# Patient Record
Sex: Male | Born: 1980 | Race: White | Hispanic: No | Marital: Single | Smoking: Current every day smoker
Health system: Southern US, Community
[De-identification: ages and names within clinical notes are randomized; demographics above are authoritative.]

---

## 2004-05-22 ENCOUNTER — Emergency Department: Payer: Self-pay | Admitting: Emergency Medicine

## 2005-06-22 ENCOUNTER — Emergency Department: Payer: Self-pay | Admitting: Unknown Physician Specialty

## 2005-09-28 ENCOUNTER — Emergency Department: Payer: Self-pay | Admitting: Emergency Medicine

## 2006-05-31 ENCOUNTER — Emergency Department: Payer: Self-pay | Admitting: Emergency Medicine

## 2006-06-06 ENCOUNTER — Emergency Department: Payer: Self-pay | Admitting: Emergency Medicine

## 2006-12-30 ENCOUNTER — Emergency Department: Payer: Self-pay | Admitting: Emergency Medicine

## 2007-07-22 ENCOUNTER — Emergency Department: Payer: Self-pay | Admitting: Emergency Medicine

## 2011-12-30 ENCOUNTER — Emergency Department: Payer: Self-pay | Admitting: Emergency Medicine

## 2012-02-11 ENCOUNTER — Emergency Department: Payer: Self-pay | Admitting: Emergency Medicine

## 2012-02-11 LAB — RAPID INFLUENZA A&B ANTIGENS

## 2013-02-04 ENCOUNTER — Emergency Department: Payer: Self-pay | Admitting: Emergency Medicine

## 2015-01-11 ENCOUNTER — Emergency Department
Admission: EM | Admit: 2015-01-11 | Discharge: 2015-01-11 | Disposition: A | Discharge status: Home / Self Care | Payer: Self-pay | Attending: Emergency Medicine | Admitting: Emergency Medicine

## 2015-01-11 ENCOUNTER — Emergency Department: Payer: Self-pay

## 2015-01-11 ENCOUNTER — Encounter: Payer: Self-pay | Admitting: Emergency Medicine

## 2015-01-11 DIAGNOSIS — F1210 Cannabis abuse, uncomplicated: Secondary | ICD-10-CM | POA: Insufficient documentation

## 2015-01-11 DIAGNOSIS — F1110 Opioid abuse, uncomplicated: Secondary | ICD-10-CM | POA: Insufficient documentation

## 2015-01-11 DIAGNOSIS — Z880 Allergy status to penicillin: Secondary | ICD-10-CM | POA: Insufficient documentation

## 2015-01-11 DIAGNOSIS — F17200 Nicotine dependence, unspecified, uncomplicated: Secondary | ICD-10-CM | POA: Insufficient documentation

## 2015-01-11 DIAGNOSIS — R079 Chest pain, unspecified: Secondary | ICD-10-CM | POA: Insufficient documentation

## 2015-01-11 DIAGNOSIS — R0602 Shortness of breath: Secondary | ICD-10-CM | POA: Insufficient documentation

## 2015-01-11 LAB — URINE DRUG SCREEN, QUALITATIVE (ARMC ONLY)
AMPHETAMINES, UR SCREEN: NOT DETECTED
BENZODIAZEPINE, UR SCRN: NOT DETECTED
Barbiturates, Ur Screen: NOT DETECTED
COCAINE METABOLITE, UR ~~LOC~~: NOT DETECTED
Cannabinoid 50 Ng, Ur ~~LOC~~: POSITIVE — AB
MDMA (Ecstasy)Ur Screen: NOT DETECTED
Methadone Scn, Ur: NOT DETECTED
OPIATE, UR SCREEN: POSITIVE — AB
PHENCYCLIDINE (PCP) UR S: NOT DETECTED
Tricyclic, Ur Screen: NOT DETECTED

## 2015-01-11 LAB — CBC
HEMATOCRIT: 43.3 % (ref 40.0–52.0)
HEMOGLOBIN: 14.9 g/dL (ref 13.0–18.0)
MCH: 32.3 pg (ref 26.0–34.0)
MCHC: 34.3 g/dL (ref 32.0–36.0)
MCV: 94.2 fL (ref 80.0–100.0)
Platelets: 167 10*3/uL (ref 150–440)
RBC: 4.6 MIL/uL (ref 4.40–5.90)
RDW: 13.4 % (ref 11.5–14.5)
WBC: 12.8 10*3/uL — ABNORMAL HIGH (ref 3.8–10.6)

## 2015-01-11 LAB — COMPREHENSIVE METABOLIC PANEL
ALK PHOS: 80 U/L (ref 38–126)
ALT: 28 U/L (ref 17–63)
AST: 51 U/L — ABNORMAL HIGH (ref 15–41)
Albumin: 3.8 g/dL (ref 3.5–5.0)
Anion gap: 4 — ABNORMAL LOW (ref 5–15)
BILIRUBIN TOTAL: 0.3 mg/dL (ref 0.3–1.2)
BUN: 10 mg/dL (ref 6–20)
CALCIUM: 9.1 mg/dL (ref 8.9–10.3)
CO2: 28 mmol/L (ref 22–32)
Chloride: 105 mmol/L (ref 101–111)
Creatinine, Ser: 0.84 mg/dL (ref 0.61–1.24)
Glucose, Bld: 104 mg/dL — ABNORMAL HIGH (ref 65–99)
Potassium: 4.8 mmol/L (ref 3.5–5.1)
Sodium: 137 mmol/L (ref 135–145)
TOTAL PROTEIN: 7.4 g/dL (ref 6.5–8.1)

## 2015-01-11 LAB — TROPONIN I: Troponin I: <0.03 ng/mL (ref <0.031)

## 2015-01-11 IMAGING — CT CT ANGIO CHEST
1 of 2 series · 18 of 30 positions shown · IV contrast (APPLIED)
Comparison: [DATE]

CLINICAL DATA: Acute mid sternal chest pain with shortness of
breath since [REDACTED].

EXAM:
CT ANGIOGRAPHY CHEST WITH CONTRAST
TECHNIQUE: Multidetector CT imaging of the chest was performed using the
standard protocol during bolus administration of intravenous
contrast. Multiplanar CT image reconstructions and MIPs were
obtained to evaluate the vascular anatomy.
CONTRAST:  75mL OMNIPAQUE IOHEXOL 350 MG/ML SOLN

[Series 5: pe 1.0 thins · axial · 0.64mm/px · z∈[-708,-404]mm · 18 of 345 slices shown]
[im 20/345  lung]
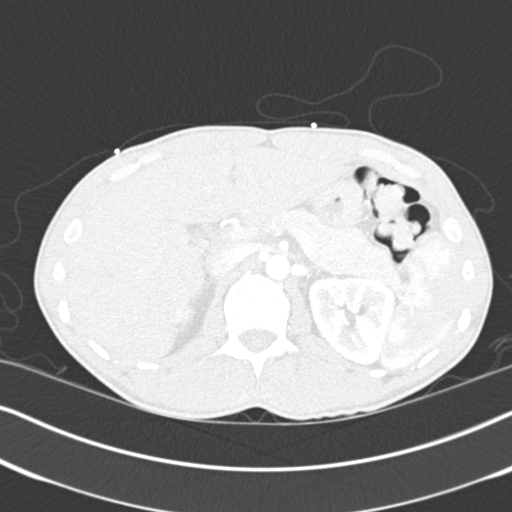
[im 39/345  mediastinal]
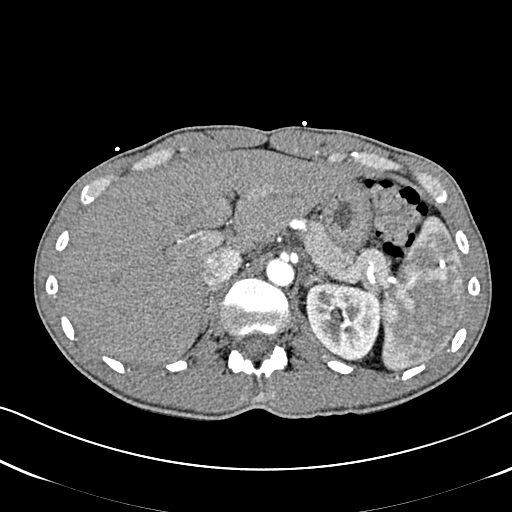
[im 58/345  lung]
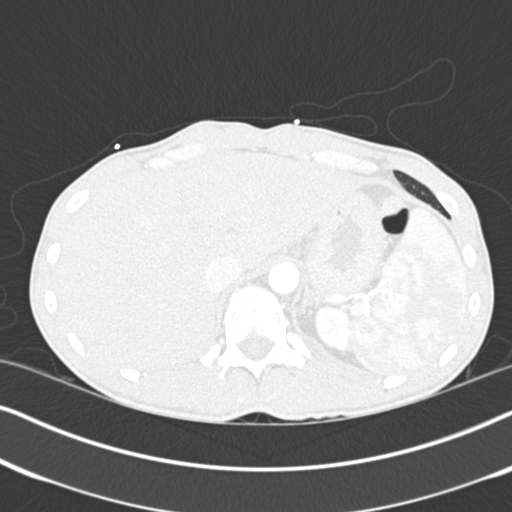
[im 77/345  mediastinal]
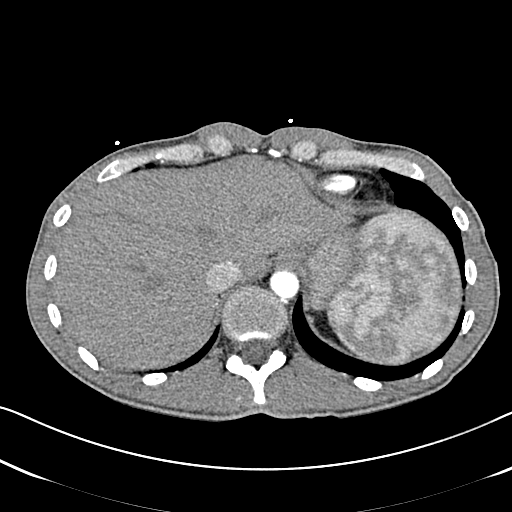
[im 96/345  lung]
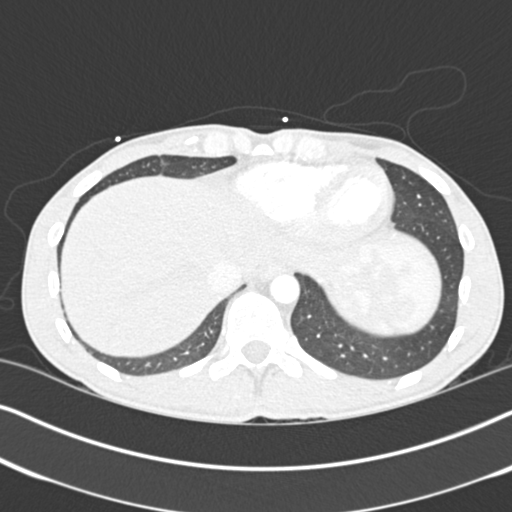
[im 115/345  mediastinal]
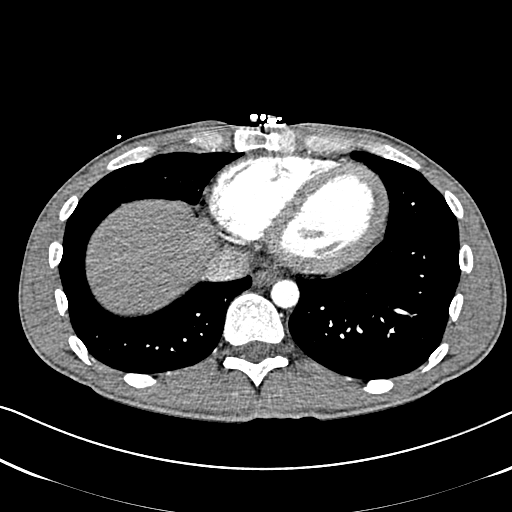
[im 134/345  lung]
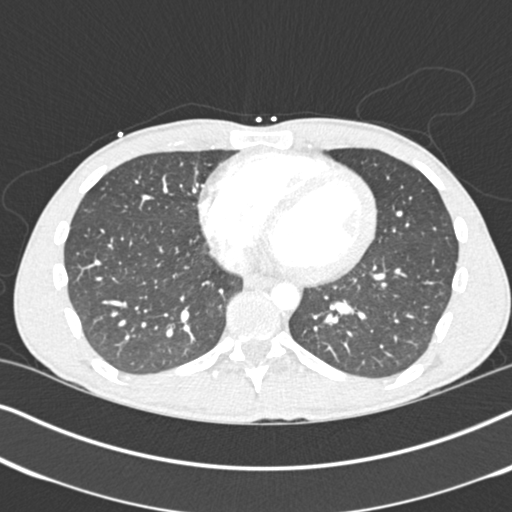
[im 153/345  mediastinal]
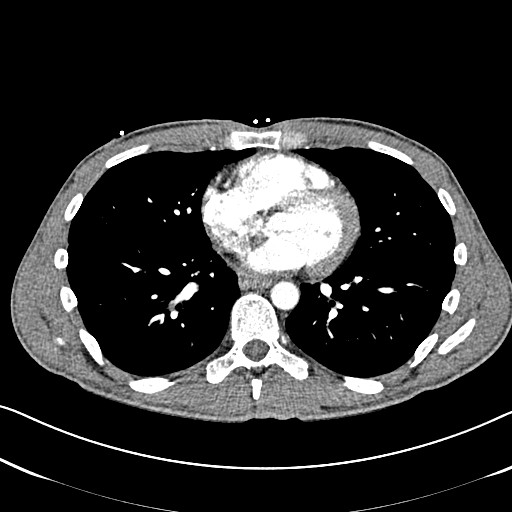
[im 163/345  lung]
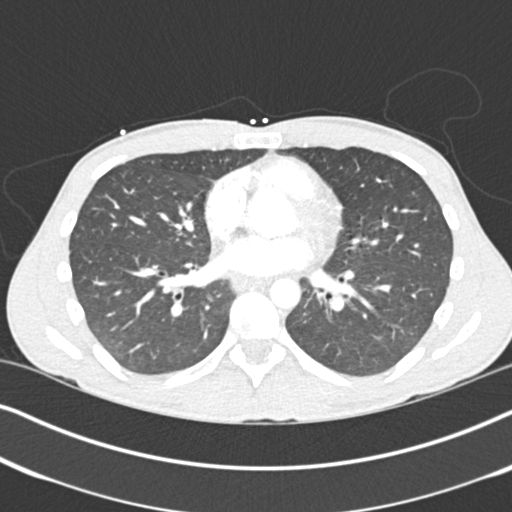
[im 173/345  mediastinal]
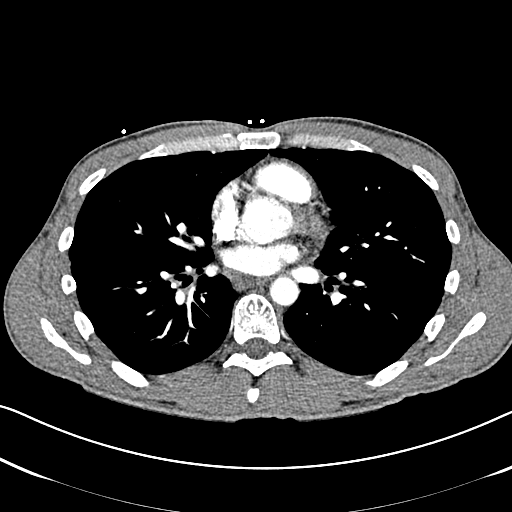
[im 192/345  lung]
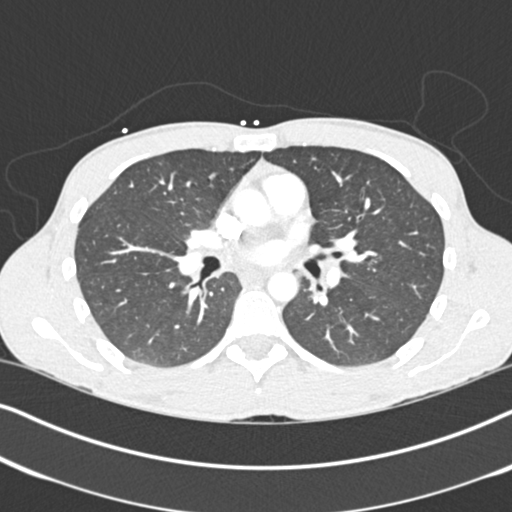
[im 211/345  mediastinal]
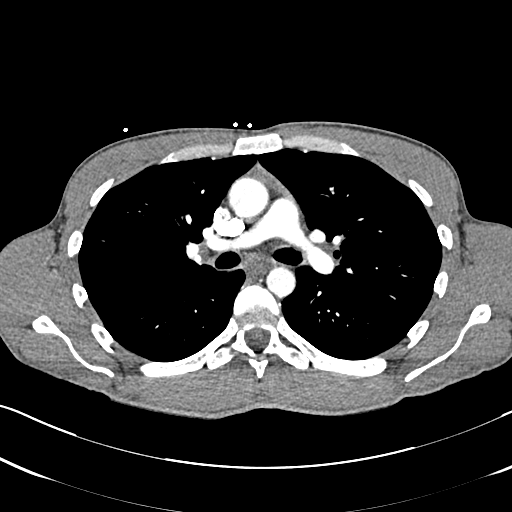
[im 230/345  lung]
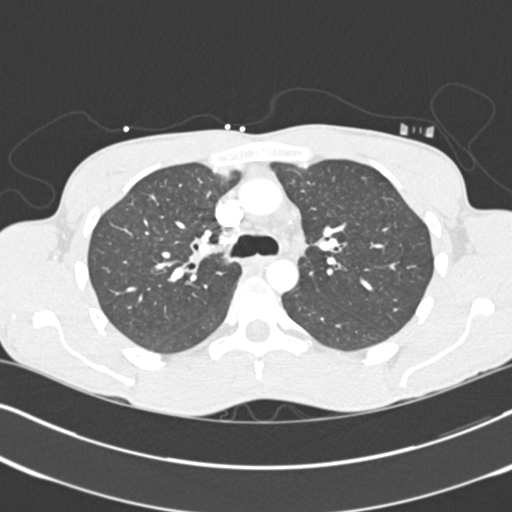
[im 249/345  mediastinal]
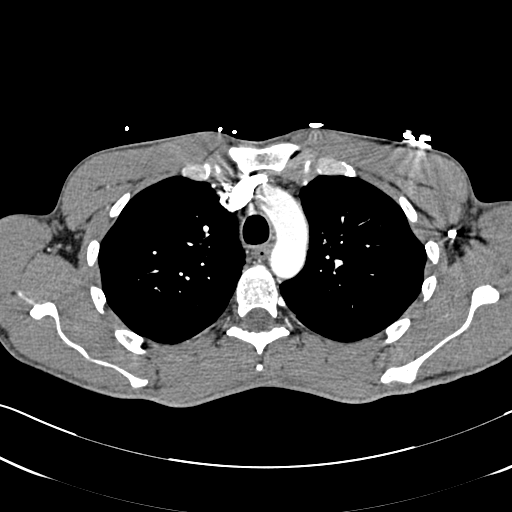
[im 268/345  lung]
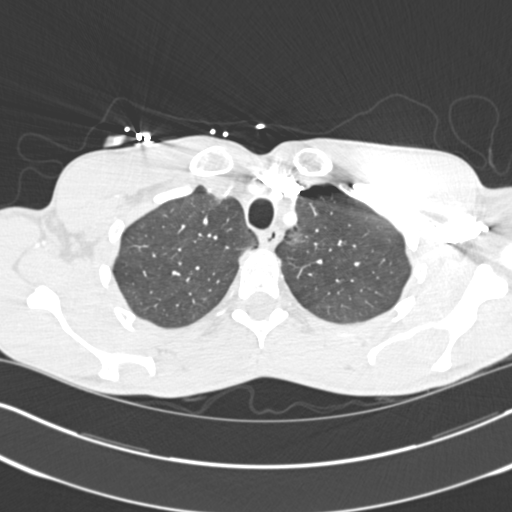
[im 287/345  mediastinal]
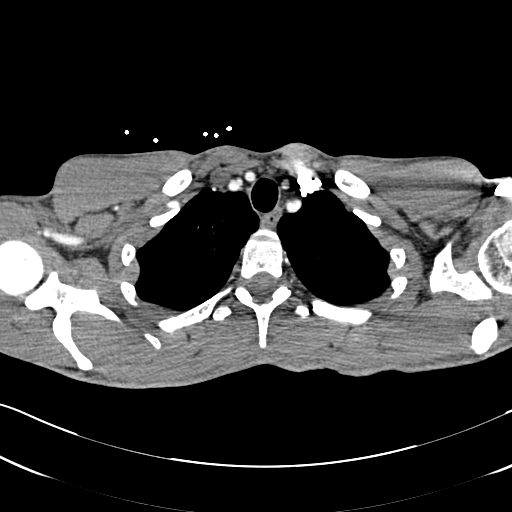
[im 306/345  lung]
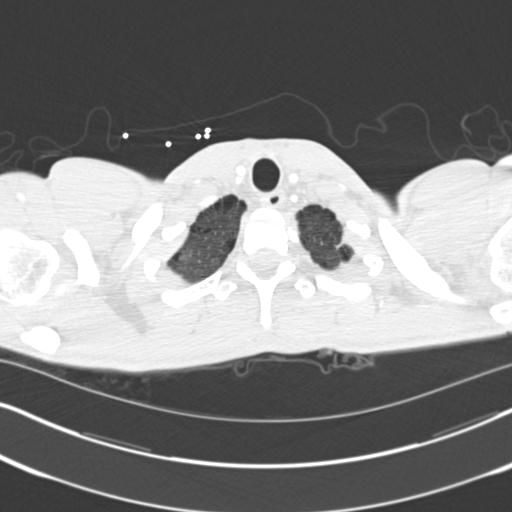
[im 325/345  mediastinal]
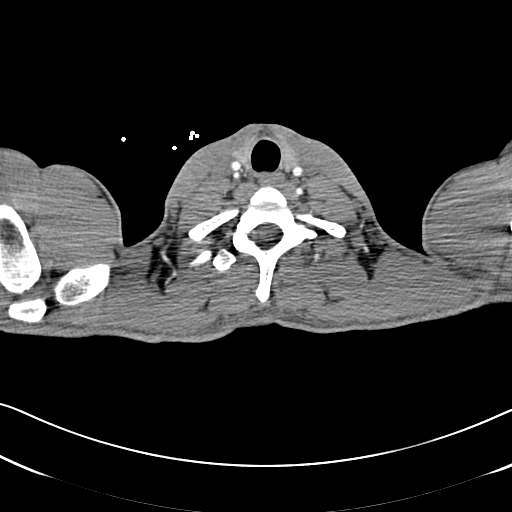

[18 of 30 positions shown; findings below may reference images not displayed]

FINDINGS: Mediastinum/Lymph Nodes: No pulmonary emboli or thoracic aortic
dissection identified. No masses or pathologically enlarged lymph
nodes identified.

Lungs/Pleura: Mild upper lobe paraseptal emphysema. Small apical
subpleural blebs. No focal pneumonia, collapse or consolidation. No
edema or interstitial process. No pleural abnormality or pleural
effusion. Negative for pneumothorax. Trachea and central airways are
patent.

Upper abdomen: Splenic calcified granulomas evident. Nonobstructing
left upper pole renal cyst measures 7 mm, image 150. No other acute
upper abdominal process.

Musculoskeletal: No acute osseous finding. No chest wall abnormality
or asymmetry.

Review of the MIP images confirms the above findings.
IMPRESSION: No significant acute pulmonary embolus by CTA.

No acute intra thoracic finding.

Splenic granulomata

Nonobstructing left nephrolithiasis

## 2015-01-11 MED ORDER — IOHEXOL 350 MG/ML SOLN
75.0000 mL | Freq: Once | INTRAVENOUS | Status: AC | PRN
  Administered 2015-01-11: 75 mL via INTRAVENOUS

## 2015-01-11 MED ORDER — OXYCODONE-ACETAMINOPHEN 5-325 MG PO TABS
1.0000 | ORAL_TABLET | Freq: Once | ORAL | Status: AC
  Administered 2015-01-11: 1 via ORAL
  Filled 2015-01-11: qty 1

## 2015-01-11 MED ORDER — SODIUM CHLORIDE 0.9 % IV BOLUS (SEPSIS)
1000.0000 mL | Freq: Once | INTRAVENOUS | Status: AC
  Administered 2015-01-11: 1000 mL via INTRAVENOUS

## 2015-01-11 MED ORDER — CARISOPRODOL 350 MG PO TABS: 350.0000 mg | ORAL_TABLET | Freq: Three times a day (TID) | ORAL | Status: DC | PRN

## 2015-01-11 MED ORDER — MORPHINE SULFATE (PF) 4 MG/ML IV SOLN
4.0000 mg | Freq: Once | INTRAVENOUS | Status: AC
  Administered 2015-01-11: 4 mg via INTRAVENOUS
  Filled 2015-01-11: qty 1

## 2015-01-11 MED ORDER — ONDANSETRON HCL 4 MG/2ML IJ SOLN
4.0000 mg | Freq: Once | INTRAMUSCULAR | Status: AC
  Administered 2015-01-11: 4 mg via INTRAVENOUS
  Filled 2015-01-11: qty 2

## 2015-01-11 NOTE — ED Notes (Signed)
C/o mid sternal cp with sob since Thursday

## 2015-01-11 NOTE — ED Provider Notes (Signed)
[facility] Emergency Department Provider Note  ____________________________________________  Time seen: Approximately 815 AM  I have reviewed the triage vital signs and the nursing notes.   HISTORY  Chief Complaint Chest Pain    HPI Chad Wilcox is a 34 y.o. male with midsternal chest pain that started Thursday night suddenly. He has been having shortness of breath and difficulty breathing secondary to pain with deep breathing. Says that the pain at this time is a 10 out of 10. No medical history. He does smoke. Pain is nonradiating.Quality the pain is sharp.   History reviewed. No pertinent past medical history.  There are no active problems to display for this patient.   History reviewed. No pertinent past surgical history.  No current outpatient prescriptions on file.  Allergies Penicillins  No family history on file.  Social History Social History  Substance Use Topics  . Smoking status: Current Every Day Smoker  . Smokeless tobacco: None  . Alcohol Use: Yes     Comment: occassional    Review of Systems Constitutional: No fever/chills Eyes: No visual changes. ENT: No sore throat. Cardiovascular: As above Respiratory: As above  Gastrointestinal: No abdominal pain.  No nausea, no vomiting.  No diarrhea.  No constipation. Genitourinary: Negative for dysuria. Musculoskeletal: Negative for back pain. Skin: Negative for rash. Neurological: Negative for headaches, focal weakness or numbness.  10-point ROS otherwise negative.  ____________________________________________   PHYSICAL EXAM:  VITAL SIGNS: ED Triage Vitals  Enc Vitals Group     BP 01/11/15 0740 115/93 mmHg     Pulse Rate 01/11/15 0740 96     Resp 01/11/15 0740 18     Temp 01/11/15 0740 97.9 F (36.6 C)     Temp Source 01/11/15 0740 Oral     SpO2 01/11/15 0740 100 %     Weight 01/11/15 0740 135 lb (61.236 kg)     Height 01/11/15 0740 5\' 8"  (1.727 m)     Head Cir  --      Peak Flow --      Pain Score 01/11/15 0740 8     Pain Loc --      Pain Edu? --      Excl. in GC? --     Constitutional: Alert and oriented. Patient is sitting up on the side of the bed rocking in pain. Eyes: Conjunctivae are normal. PERRL. EOMI. Head: Atraumatic. Nose: No congestion/rhinnorhea. Mouth/Throat: Mucous membranes are moist.  Oropharynx non-erythematous. Neck: No stridor.   Cardiovascular: Normal rate, regular rhythm. Grossly normal heart sounds.  Good peripheral circulation. Respiratory: Normal respiratory effort.  No retractions. Mildly decreased lung sounds on the right.  Gastrointestinal: Soft and nontender. No distention. No abdominal bruits. No CVA tenderness. Musculoskeletal: No lower extremity tenderness nor edema.  No joint effusions. Neurologic:  Normal speech and language. No gross focal neurologic deficits are appreciated. No gait instability. Skin:  Skin is warm, dry and intact. No rash noted. Psychiatric: Mood and affect are normal. Speech and behavior are normal.  ____________________________________________   LABS (all labs ordered are listed, but only abnormal results are displayed)  Labs Reviewed  CBC - Abnormal; Notable for the following:    WBC 12.8 (*)    All other components within normal limits  COMPREHENSIVE METABOLIC PANEL - Abnormal; Notable for the following:    Glucose, Bld 104 (*)    AST 51 (*)    Anion gap 4 (*)    All other components within normal limits  URINE DRUG SCREEN, QUALITATIVE ([facility] ONLY) - Abnormal; Notable for the following:    Opiate, Ur Screen POSITIVE (*)    Cannabinoid 50 Ng, Ur Wade POSITIVE (*)    All other components within normal limits  TROPONIN I  TROPONIN I   ____________________________________________  EKG  ED ECG REPORT I, Schaevitz,  Teena Irani, the attending physician, personally viewed and interpreted this ECG.   Date: 01/11/2015  EKG Time: 744  Rate: 96  Rhythm: normal sinus rhythm  Axis:  Rightward axis deviation  Intervals:T-wave inversions in 3 and aVF with large P waves in 1 and aVL. These changes are new from previous.  ST&T Change: See intervals  ED ECG REPORT I, Pershing Proud  Teena Irani, the attending physician, personally viewed and interpreted this ECG.   Date: 01/11/2015  EKG Time: 1024  Rate: 73  Rhythm: normal sinus rhythm  Axis: Normal axis  Intervals:none  ST&T Change: No ST elevations or depressions. No abnormal T-wave inversions. Different in morphology from previous but also on different EKG  machine with different leads.  ____________________________________________  RADIOLOGY  NAD.  No significant acute pulmonary embolus by CTA. No acute intrathoracic finding. ____________________________________________   PROCEDURES    ____________________________________________   INITIAL IMPRESSION / ASSESSMENT AND PLAN / ED COURSE  Pertinent labs & imaging results that were available during my care of the patient were reviewed by me and considered in my medical decision making (see chart for details).  ----------------------------------------- 1:06 PM on 01/11/2015 -----------------------------------------  Patient resting comfortably at this time and pain is relieved after morphine and Percocet. Unclear cause of the pain but patient now saying that the pain was worsened when he moved. He denies any exertional component or worsening of his chest pain with activity. Also denying any history of cardiac disease in his family. He smokes marijuana every day and I did discuss with him that this may contribute to some things that cause chest pain such as "air between your heart and her lungs" which I described to him to explain pneumomediastinum. Patient understands return precautions. Will give muscle relaxer as it does seem to be a musculoskeletal component despite her not being reproduction with palpation. ____________________________________________   FINAL  CLINICAL IMPRESSION(S) / ED DIAGNOSES  Chest pain.    Myrna Blazer, MD 01/11/15 [phone]

## 2015-07-05 ENCOUNTER — Encounter: Payer: Self-pay | Admitting: *Deleted

## 2015-07-05 ENCOUNTER — Emergency Department
Admission: EM | Admit: 2015-07-05 | Discharge: 2015-07-05 | Disposition: A | Discharge status: Home / Self Care | Payer: Self-pay | Attending: Emergency Medicine | Admitting: Emergency Medicine

## 2015-07-05 DIAGNOSIS — H109 Unspecified conjunctivitis: Secondary | ICD-10-CM | POA: Insufficient documentation

## 2015-07-05 DIAGNOSIS — F17210 Nicotine dependence, cigarettes, uncomplicated: Secondary | ICD-10-CM | POA: Insufficient documentation

## 2015-07-05 MED ORDER — NEOMYCIN-POLYMYXIN-DEXAMETH 0.1 % OP SUSP: 1.0000 [drp] | Freq: Three times a day (TID) | OPHTHALMIC | Status: AC

## 2015-07-05 NOTE — ED Provider Notes (Signed)
[facility] Emergency Department Provider Note  ____________________________________________  Time seen: Approximately 2:30 PM  I have reviewed the triage vital signs and the nursing notes.   HISTORY  Chief Complaint Eye Pain    HPI Chad Wilcox is a 35 y.o. male , NAD, presents to the emergency department with left eye redness, pain, swelling for 2 days. States he woke 2 days ago with swelling and crusting about the left. Noted redness about the left eye a few hours later. Has had some clear drainage from the eye and it feels irritated and itching. States he was working in the yard the day prior to the onset of these symptoms but denies any injury or trauma to the eye. Denies any history of seasonal allergies. Has not had any changes in his vision or loss of vision. Also notes a swollen bump in front of the left ear. Denies fevers, chills, body aches. Denies nasal congestion, runny nose, ear pain, sore throat.   History reviewed. No pertinent past medical history.  There are no active problems to display for this patient.   History reviewed. No pertinent past surgical history.  Current Outpatient Rx  Name  Route  Sig  Dispense  Refill  . carisoprodol (SOMA) 350 MG tablet   Oral   Take 1 tablet (350 mg total) by mouth 3 (three) times daily as needed for muscle spasms.   15 tablet   0   . neomycin-polymyxin-dexamethasone (MAXITROL) 0.1 % ophthalmic suspension   Left Eye   Place 1 drop into the left eye 3 (three) times daily.   5 mL   0     Allergies Penicillins  No family history on file.  Social History Social History  Substance Use Topics  . Smoking status: Current Every Day Smoker -- 0.50 packs/day    Types: Cigarettes  . Smokeless tobacco: None  . Alcohol Use: Yes     Comment: occassional     Review of Systems  Constitutional: No fever/chills Eyes: Positive left eye swelling, redness, pain, discharge. No visual changes. No  discharge, swelling, redness, pain about the right eye. ENT: No sore throat, nasal congestion, runny nose, sore throat, ear pain. Cardiovascular: No chest pain. Respiratory: No cough. No shortness of breath. No wheezing.  Musculoskeletal: Negative for neck pain.  Skin: Negative for rash. Neurological: Negative for headaches, focal weakness or numbness. 10-point ROS otherwise negative.  ____________________________________________   PHYSICAL EXAM:  VITAL SIGNS: ED Triage Vitals  Enc Vitals Group     BP 07/05/15 1405 109/56 mmHg     Pulse Rate 07/05/15 1405 77     Resp 07/05/15 1405 20     Temp 07/05/15 1405 98.2 F (36.8 C)     Temp Source 07/05/15 1405 Oral     SpO2 07/05/15 1405 98 %     Weight 07/05/15 1405 140 lb (63.504 kg)     Height 07/05/15 1405  (1.727 m)     Head Cir --      Peak Flow --      Pain Score 07/05/15 1405 5     Pain Loc --      Pain Edu? --      Excl. in GC? --      Constitutional: Alert and oriented. Well appearing and in no acute distress. Eyes: Left conjunctiva with moderate erythema. Left upper and lower eyelids with trace erythema and mild swelling. Clear mucoid discharge noted from the left eye. No pain to  palpation of the globe of left eye. Right eye and eyelids without abnormalities. PERRLA. EOMI without pain.  Head: Atraumatic. ENT:      Ears: TMs could not be visualized bilaterally due to significant cerumen. No pain to palpation of bilateral tragus.      Nose: No congestion/rhinnorhea.      Mouth/Throat: Mucous membranes are moist.  Neck: Supple with full range of motion Hematological/Lymphatic/Immunilogical: Positive left preauricular lymphadenopathy with mild tenderness to palpation but is mobile. No cervical lymphadenopathy. Respiratory: Normal respiratory effort without tachypnea or retractions.  Neurologic:  Normal speech and language. No gross focal neurologic deficits are appreciated. Gait and posture are normal. Skin:  Skin is  warm, dry and intact. No rash noted. Psychiatric: Mood and affect are normal. Speech and behavior are normal. Patient exhibits appropriate insight and judgement.   ____________________________________________   LABS  None ____________________________________________  EKG  None ____________________________________________  RADIOLOGY  None ____________________________________________    PROCEDURES  Procedure(s) performed: None    Medications - No data to display   ____________________________________________   INITIAL IMPRESSION / ASSESSMENT AND PLAN / ED COURSE  Patient's diagnosis is consistent with conjunctivitis of the left eye. Patient will be discharged home with prescriptions for Maxitrol eyedrops to use as directed. Patient is to [facility] up [facility] if symptoms persist past this treatment course. Patient is given ED precautions to return to the ED for any worsening or new symptoms.    ____________________________________________  FINAL CLINICAL IMPRESSION(S) / ED DIAGNOSES  Final diagnoses:  Conjunctivitis of left eye      NEW MEDICATIONS STARTED DURING THIS VISIT:  New Prescriptions   NEOMYCIN-POLYMYXIN-DEXAMETHASONE (MAXITROL) 0.1 % OPHTHALMIC SUSPENSION    Place 1 drop into the left eye 3 (three) times daily.         Hope PigeonJami L Hagler, PA-C 07/05/15 1457  Sharyn CreamerMark Quale, MD 07/05/15 [phone]

## 2015-07-05 NOTE — ED Notes (Signed)
Pt left eye is red and swollen, pt denies any vision problems, pt has a lump by left ear, pt denies any other problems

## 2015-07-05 NOTE — Discharge Instructions (Signed)
Bacterial Conjunctivitis °Bacterial conjunctivitis (commonly called pink eye) is redness, soreness, or puffiness (inflammation) of the white part of your eye. It is caused by a germ called bacteria. These germs can easily spread from person to person (contagious). Your eye often will become red or pink. Your eye may also become irritated, watery, or have a thick discharge.  °HOME CARE  °1. Apply a cool, clean washcloth over closed eyelids. Do this for 10-20 minutes, 3-4 times a day while you have pain. °2. Gently wipe away any fluid coming from the eye with a warm, wet washcloth or cotton ball. °3. Wash your hands often with soap and water. Use paper towels to dry your hands. °4. Do not share towels or washcloths. °5. Change or wash your pillowcase every day. °6. Do not use eye makeup until the infection is gone. °7. Do not use machines or drive if your vision is blurry. °8. Stop using contact lenses. Do not use them again until your doctor says it is okay. °9. Do not touch the tip of the eye drop bottle or medicine tube with your fingers when you put medicine on the eye. °GET HELP RIGHT AWAY IF:  °1. Your eye is not better after 3 days of starting your medicine. °2. You have a yellowish fluid coming out of the eye. °3. You have more pain in the eye. °4. Your eye redness is spreading. °5. Your vision becomes blurry. °6. You have a fever or lasting symptoms for more than 2-3 days. °7. You have a fever and your symptoms suddenly get worse. °8. You have pain in the face. °9. Your face gets red or puffy (swollen). °MAKE SURE YOU:  °· Understand these instructions. °· Will watch this condition. °· Will get help right away if you are not doing well or get worse. °  °This information is not intended to replace advice given to you by your health care provider. Make sure you discuss any questions you have with your health care provider. °  °Document Released: 10/14/2007 Document Revised: 12/22/2011 Document Reviewed:  09/10/2011 °Elsevier Interactive Patient Education ©2016 [employer] ° °How to Use Eye Drops and Eye Ointments °HOW TO APPLY EYE DROPS °Follow these steps when applying eye drops: °10. Wash your hands. °11. Tilt your head back. °12. Put a finger under your eye and use it to gently pull your lower lid downward. Keep that finger in place. °13. Using your other hand, hold the dropper between your thumb and index finger. °14. Position the dropper just over the edge of the lower lid. Hold it as close to your eye as you can without touching the dropper to your eye. °15. Steady your hand. One way to do this is to lean your index finger against your brow. °16. Look up. °17. Slowly and gently squeeze one drop of medicine into your eye. °18. Close your eye. °19. Place a finger between your lower eyelid and your nose. Press gently for 2 minutes. This increases the amount of time that the medicine is exposed to the eye. It also reduces side effects that can develop if the drop gets into the bloodstream through the nose. °HOW TO APPLY EYE OINTMENTS °Follow these steps when applying eye ointments: °10. Wash your hands. °11. Put a finger under your eye and use it to gently pull your lower lid downward. Keep that finger in place. °12. Using your other hand, place the tip of the tube between your thumb and index finger with   the remaining fingers braced against your cheek or nose. °13. Hold the tube just over the edge of your lower lid without touching the tube to your lid or eyeball. °14. Look up. °15. Line the inner part of your lower lid with ointment. °16. Gently pull up on your upper lid and look down. This will force the ointment to spread over the surface of the eye. °17. Release the upper lid. °18. If you can, close your eyes for 1-2 minutes. °Do not rub your eyes. If you applied the ointment correctly, your vision will be blurry for a few minutes. This is normal. °ADDITIONAL INFORMATION °· Make sure to use the eye drops or  ointment as told by your health care provider. °· If you have been told to use both eye drops and an eye ointment, apply the eye drops first, then wait 3-4 minutes before you apply the ointment. °· Try not to touch the tip of the dropper or tube to your eye. A dropper or tube that has touched the eye can become contaminated. °  °This information is not intended to replace advice given to you by your health care provider. Make sure you discuss any questions you have with your health care provider. °  °Document Released: 04/12/2000 Document Revised: 05/21/2014 Document Reviewed: 12/31/2013 °Elsevier Interactive Patient Education ©2016 [employer] ° °

## 2016-08-12 ENCOUNTER — Emergency Department: Payer: Self-pay

## 2016-08-12 ENCOUNTER — Emergency Department
Admission: EM | Admit: 2016-08-12 | Discharge: 2016-08-12 | Disposition: A | Discharge status: Home / Self Care | Payer: Self-pay | Attending: Student in an Organized Health Care Education/Training Program | Admitting: Student in an Organized Health Care Education/Training Program

## 2016-08-12 ENCOUNTER — Encounter: Payer: Self-pay | Admitting: Emergency Medicine

## 2016-08-12 DIAGNOSIS — R0600 Dyspnea, unspecified: Secondary | ICD-10-CM | POA: Insufficient documentation

## 2016-08-12 DIAGNOSIS — R079 Chest pain, unspecified: Secondary | ICD-10-CM | POA: Insufficient documentation

## 2016-08-12 DIAGNOSIS — F17210 Nicotine dependence, cigarettes, uncomplicated: Secondary | ICD-10-CM | POA: Insufficient documentation

## 2016-08-12 DIAGNOSIS — R0602 Shortness of breath: Secondary | ICD-10-CM | POA: Insufficient documentation

## 2016-08-12 LAB — CBC
HCT: 44 % (ref 40.0–52.0)
HEMOGLOBIN: 14.9 g/dL (ref 13.0–18.0)
MCH: 31.8 pg (ref 26.0–34.0)
MCHC: 33.9 g/dL (ref 32.0–36.0)
MCV: 93.7 fL (ref 80.0–100.0)
PLATELETS: 168 10*3/uL (ref 150–440)
RBC: 4.69 MIL/uL (ref 4.40–5.90)
RDW: 13.9 % (ref 11.5–14.5)
WBC: 11.2 10*3/uL — AB (ref 3.8–10.6)

## 2016-08-12 LAB — BASIC METABOLIC PANEL
ANION GAP: 6 (ref 5–15)
BUN: 11 mg/dL (ref 6–20)
CALCIUM: 9.1 mg/dL (ref 8.9–10.3)
CO2: 28 mmol/L (ref 22–32)
CREATININE: 0.79 mg/dL (ref 0.61–1.24)
Chloride: 105 mmol/L (ref 101–111)
Glucose, Bld: 84 mg/dL (ref 65–99)
Potassium: 4.6 mmol/L (ref 3.5–5.1)
SODIUM: 139 mmol/L (ref 135–145)

## 2016-08-12 LAB — TROPONIN I

## 2016-08-12 MED ORDER — PREDNISONE 20 MG PO TABS: 40.0000 mg | ORAL_TABLET | Freq: Every day | ORAL | 0 refills | Status: AC

## 2016-08-12 MED ORDER — IBUPROFEN 200 MG PO TABS: 600.0000 mg | ORAL_TABLET | Freq: Four times a day (QID) | ORAL | 0 refills | Status: AC | PRN

## 2016-08-12 MED ORDER — IPRATROPIUM-ALBUTEROL 0.5-2.5 (3) MG/3ML IN SOLN
3.0000 mL | Freq: Once | RESPIRATORY_TRACT | Status: AC
  Administered 2016-08-12: 3 mL via RESPIRATORY_TRACT
  Filled 2016-08-12: qty 9

## 2016-08-12 MED ORDER — ALBUTEROL SULFATE HFA 108 (90 BASE) MCG/ACT IN AERS: 2.0000 | INHALATION_SPRAY | Freq: Four times a day (QID) | RESPIRATORY_TRACT | 2 refills | Status: DC | PRN

## 2016-08-12 MED ORDER — NAPROXEN 500 MG PO TABS
500.0000 mg | ORAL_TABLET | Freq: Once | ORAL | Status: AC
  Administered 2016-08-12: 500 mg via ORAL
  Filled 2016-08-12 (×2): qty 1

## 2016-08-12 MED ORDER — PREDNISONE 20 MG PO TABS
60.0000 mg | ORAL_TABLET | Freq: Once | ORAL | Status: AC
  Administered 2016-08-12: 60 mg via ORAL
  Filled 2016-08-12: qty 3

## 2016-08-12 NOTE — ED Provider Notes (Signed)
[facility] Emergency Department Provider Note    None    (approximate)  I have reviewed the triage vital signs and the nursing notes.   HISTORY  Chief Complaint Chest Pain and Shortness of Breath    HPI Aletha HalimSteven D Blahnik is a 36 y.o. male with a history of smoking presents with chest pain and shortness of breath for the past 3 days. Patient states the pain is mid sternal radiating through to his back and does get worse with movement and deep breathing. No diaphoresis. No hemoptysis. No recent fevers. States he always has thick phlegm productive. Denies any orthopnea. Patient states he woke up tonight due to worsening pain.   Family history of sudden cardiac death or early CAD. Patient otherwise healthy. Denies any abdominal pain.   History reviewed. No pertinent past medical history. No family history on file. History reviewed. No pertinent surgical history. There are no active problems to display for this patient.     Prior to Admission medications   Medication Sig Start Date End Date Taking? Authorizing Provider  albuterol (PROVENTIL HFA;VENTOLIN HFA) 108 (90 Base) MCG/ACT inhaler Inhale 2 puffs into the lungs every 6 (six) hours as needed for wheezing or shortness of breath. 08/12/16   Willy Eddyobinson, Marks Scalera, MD  carisoprodol (SOMA) 350 MG tablet Take 1 tablet (350 mg total) by mouth 3 (three) times daily as needed for muscle spasms. 01/11/15   Schaevitz, Myra Rudeavid Matthew, MD  ibuprofen (MOTRIN IB) 200 MG tablet Take 3 tablets (600 mg total) by mouth every 6 (six) hours as needed for moderate pain. 08/12/16 08/19/16  Willy Eddyobinson, Sylwia Cuervo, MD  predniSONE (DELTASONE) 20 MG tablet Take 2 tablets (40 mg total) by mouth daily. 08/12/16 08/17/16  Willy Eddyobinson, Ninah Moccio, MD    Allergies Penicillins    Social History Social History  Substance Use Topics  . Smoking status: Current Every Day Smoker    Packs/day: 1.00    Types: Cigarettes  . Smokeless tobacco: Never Used  .  Alcohol use Yes     Comment: occassional    Review of Systems Patient denies headaches, rhinorrhea, blurry vision, numbness, shortness of breath, chest pain, edema, cough, abdominal pain, nausea, vomiting, diarrhea, dysuria, fevers, rashes or hallucinations unless otherwise stated above in HPI. ____________________________________________   PHYSICAL EXAM:  VITAL SIGNS: Vitals:   08/12/16 1141 08/12/16 1434  BP: 125/83 120/75  Pulse: 92 82  Resp: 18   Temp: 99.1 F (37.3 C)     Constitutional: Alert and oriented. Well appearing and in no acute distress. Eyes: Conjunctivae are normal.  Head: Atraumatic. Nose: No congestion/rhinnorhea. Mouth/Throat: Mucous membranes are moist.   Neck: No stridor. Painless ROM.  Cardiovascular: Normal rate, regular rhythm. Grossly normal heart sounds.  Good peripheral circulation. Respiratory: Normal respiratory effort.  No retractions. Lungs Coarse bibasilar breathsounds Gastrointestinal: Soft and nontender. No distention. No abdominal bruits. No CVA tenderness. Genitourinary:  Musculoskeletal: No lower extremity tenderness nor edema.  No joint effusions. Neurologic:  Normal speech and language. No gross focal neurologic deficits are appreciated. No facial droop Skin:  Skin is warm, dry and intact. No rash noted. Psychiatric: Mood and affect are normal. Speech and behavior are normal.  ____________________________________________   LABS (all labs ordered are listed, but only abnormal results are displayed)  Results for orders placed or performed during the hospital encounter of 08/12/16 (from the past 24 hour(s))  Basic metabolic panel     Status: None   Collection Time: 08/12/16 11:40 AM  Result  Value Ref Range   Sodium 139 135 - 145 mmol/L   Potassium 4.6 3.5 - 5.1 mmol/L   Chloride 105 101 - 111 mmol/L   CO2 28 22 - 32 mmol/L   Glucose, Bld 84 65 - 99 mg/dL   BUN 11 6 - 20 mg/dL   Creatinine, Ser 1.610.79 0.61 - 1.24 mg/dL   Calcium  9.1 8.9 - 09.610.3 mg/dL   GFR calc non Af Amer >60 >60 mL/min   GFR calc Af Amer >60 >60 mL/min   Anion gap 6 5 - 15  CBC     Status: Abnormal   Collection Time: 08/12/16 11:40 AM  Result Value Ref Range   WBC 11.2 (H) 3.8 - 10.6 K/uL   RBC 4.69 4.40 - 5.90 MIL/uL   Hemoglobin 14.9 13.0 - 18.0 g/dL   HCT 04.544.0 40.940.0 - 81.152.0 %   MCV 93.7 80.0 - 100.0 fL   MCH 31.8 26.0 - 34.0 pg   MCHC 33.9 32.0 - 36.0 g/dL   RDW 91.413.9 78.211.5 - 95.614.5 %   Platelets 168 150 - 440 K/uL  Troponin I     Status: None   Collection Time: 08/12/16 11:40 AM  Result Value Ref Range   Troponin I <0.03 <0.03 ng/mL   ____________________________________________  EKG My review and personal interpretation at Time: 11:46   Indication: chest pain  Rate: 85  Rhythm: sinus Axis: normal Other: no stemi, subtle pr depressions diffusely, non specific st changes ____________________________________________  RADIOLOGY  I personally reviewed all radiographic images ordered to evaluate for the above acute complaints and reviewed radiology reports and findings.  These findings were personally discussed with the patient.  Please see medical record for radiology report.  ____________________________________________   PROCEDURES  Procedure(s) performed:  Procedures    Critical Care performed: no ____________________________________________   INITIAL IMPRESSION / ASSESSMENT AND PLAN / ED COURSE  Pertinent labs & imaging results that were available during my care of the patient were reviewed by me and considered in my medical decision making (see chart for details).  DDX: ACS, pericarditis, esophagitis, boerhaaves, pe, dissection, pna, bronchitis, costochondritis   Aletha HalimSteven D Chien is a 36 y.o. who presents to the ED with chest pain as described above. Her muscle skeletal in nature. Patient also heavy smoker and chest x-ray shows evidence of probable bronchitis but no evidence of consolidation. I do not appreciate any  friction rubs or murmurs however his EKG does show some subtle PR depressions which may be suggestive of early pericarditis. No evidence of cardiac effusion on bedside ultrasound. Patient will be treated with anti-inflammatories as well as albuterol and steroids for component of bronchitis. Patient with a heart score of 1. Patient will be referred to cardiology if symptoms not improved. Patient is low risk Wells and is PERC negative.  Is not clinically consistent with PE. There is no clinical evidence or history or findings suggestive any source of intra-abdominal pathology as the source of his pain.  Have discussed with the patient and available family all diagnostics and treatments performed thus far and all questions were answered to the best of my ability. The patient demonstrates understanding and agreement with plan.       ____________________________________________   FINAL CLINICAL IMPRESSION(S) / ED DIAGNOSES  Final diagnoses:  Chest pain, unspecified type  Dyspnea, unspecified type      NEW MEDICATIONS STARTED DURING THIS VISIT:  New Prescriptions   ALBUTEROL (PROVENTIL HFA;VENTOLIN HFA) 108 (90 BASE) MCG/ACT INHALER  Inhale 2 puffs into the lungs every 6 (six) hours as needed for wheezing or shortness of breath.   IBUPROFEN (MOTRIN IB) 200 MG TABLET    Take 3 tablets (600 mg total) by mouth every 6 (six) hours as needed for moderate pain.   PREDNISONE (DELTASONE) 20 MG TABLET    Take 2 tablets (40 mg total) by mouth daily.     Note:  This document was prepared using Dragon voice recognition software and may include unintentional dictation errors.    Willy Eddy, MD 08/12/16 1455

## 2016-08-12 NOTE — ED Triage Notes (Signed)
Pt from home with cp and sob since yesterday. Pt states he thought he was getting a chest cold yesterday and it has progressively gotten worse. Pt alert & oriented with NAD noted.

## 2020-10-15 ENCOUNTER — Other Ambulatory Visit: Payer: Self-pay

## 2020-10-15 ENCOUNTER — Emergency Department: Payer: Self-pay

## 2020-10-15 ENCOUNTER — Encounter: Payer: Self-pay | Admitting: Emergency Medicine

## 2020-10-15 ENCOUNTER — Emergency Department
Admission: EM | Admit: 2020-10-15 | Discharge: 2020-10-15 | Disposition: A | Discharge status: Home / Self Care | Payer: Self-pay | Attending: Emergency Medicine | Admitting: Emergency Medicine

## 2020-10-15 DIAGNOSIS — F17210 Nicotine dependence, cigarettes, uncomplicated: Secondary | ICD-10-CM | POA: Insufficient documentation

## 2020-10-15 DIAGNOSIS — R0602 Shortness of breath: Secondary | ICD-10-CM | POA: Insufficient documentation

## 2020-10-15 DIAGNOSIS — R0789 Other chest pain: Secondary | ICD-10-CM | POA: Insufficient documentation

## 2020-10-15 LAB — CBC
HCT: 45.8 % (ref 39.0–52.0)
Hemoglobin: 16.2 g/dL (ref 13.0–17.0)
MCH: 32.9 pg (ref 26.0–34.0)
MCHC: 35.4 g/dL (ref 30.0–36.0)
MCV: 93.1 fL (ref 80.0–100.0)
Platelets: 198 10*3/uL (ref 150–400)
RBC: 4.92 MIL/uL (ref 4.22–5.81)
RDW: 13.5 % (ref 11.5–15.5)
WBC: 11.7 10*3/uL — ABNORMAL HIGH (ref 4.0–10.5)
nRBC: 0 % (ref 0.0–0.2)

## 2020-10-15 LAB — BASIC METABOLIC PANEL
Anion gap: 9 (ref 5–15)
BUN: 9 mg/dL (ref 6–20)
CO2: 25 mmol/L (ref 22–32)
Calcium: 8.8 mg/dL — ABNORMAL LOW (ref 8.9–10.3)
Chloride: 105 mmol/L (ref 98–111)
Creatinine, Ser: 0.78 mg/dL (ref 0.61–1.24)
GFR, Estimated: >60 mL/min (ref >60)
Glucose, Bld: 91 mg/dL (ref 70–99)
Potassium: 4.2 mmol/L (ref 3.5–5.1)
Sodium: 139 mmol/L (ref 135–145)

## 2020-10-15 LAB — TROPONIN I (HIGH SENSITIVITY): Troponin I (High Sensitivity): <2 ng/L (ref <18)

## 2020-10-15 MED ORDER — CYCLOBENZAPRINE HCL 5 MG PO TABS: 5.0000 mg | ORAL_TABLET | Freq: Three times a day (TID) | ORAL | 0 refills | Status: AC | PRN

## 2020-10-15 MED ORDER — IOHEXOL 350 MG/ML SOLN
75.0000 mL | Freq: Once | INTRAVENOUS | Status: AC | PRN
  Administered 2020-10-15: 75 mL via INTRAVENOUS
  Filled 2020-10-15: qty 75

## 2020-10-15 NOTE — ED Triage Notes (Signed)
Pt comes into the ED via POV c/o generalized chest pain that started a couple days ago.  Pt denies any radiating pain.  Pt denies any nausea or dizziness, but states "it hurts to breathe sometimes".  Pt ambulatory to triage at this time and in NAD.

## 2020-10-15 NOTE — ED Notes (Signed)
See triage note  presents with generalized chest discomfort  states feels hard to breath  skin w/d  color good   no cough no fever

## 2020-10-15 NOTE — ED Provider Notes (Signed)
[facility] Emergency Department Provider Note  Time seen: 11:22 AM  I have reviewed the triage vital signs and the nursing notes.   HISTORY  Chief Complaint Chest Pain   HPI Chad Wilcox is a 40 y.o. male with no past medical history presents to the emergency department for chest pain.  According to the patient over the past 3 days he has been experiencing pain to the center of his chest that has been progressively worsening.  Patient states pain with breathing, deep breathing or movement.  Patient denies any leg pain or swelling.  Patient denies any nausea or diaphoresis.  States mild shortness of breath due to discomfort with inhalation.  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   History reviewed. No pertinent past medical history.  There are no problems to display for this patient.   History reviewed. No pertinent surgical history.  Prior to Admission medications   Not on File    Allergies  Allergen Reactions   Penicillins Rash    Has patient had a PCN reaction causing immediate rash, facial/tongue/throat swelling, SOB or lightheadedness with hypotension: Yes Has patient had a PCN reaction causing severe rash involving mucus membranes or skin necrosis: No Has patient had a PCN reaction that required hospitalization No Has patient had a PCN reaction occurring within the last 10 years: No If all of the above answers are "NO", then may proceed with Cephalosporin use.    History reviewed. No pertinent family history.  Social History Social History   Tobacco Use   Smoking status: Every Day    Packs/day: 1.00    Types: Cigarettes   Smokeless tobacco: Never  Vaping Use   Vaping Use: Never used  Substance Use Topics   Alcohol use: Yes    Comment: occassional   Drug use: No    Review of Systems Constitutional: Negative for fever. Cardiovascular: Negative for chest  pain. Respiratory: Negative for shortness of breath. Gastrointestinal: Negative for abdominal pain, vomiting  Musculoskeletal: Negative for musculoskeletal complaints Neurological: Negative for headache All other ROS negative  ____________________________________________   PHYSICAL EXAM:  VITAL SIGNS: ED Triage Vitals  Enc Vitals Group     BP 10/15/20 0825 (!) 137/97     Pulse Rate 10/15/20 0825 89     Resp 10/15/20 0825 17     Temp 10/15/20 0825 98 F (36.7 C)     Temp Source 10/15/20 0825 Oral     SpO2 10/15/20 0825 99 %     Weight 10/15/20 0811 134 lb 14.7 oz (61.2 kg)     Height 10/15/20 0811 5\' 8"  (1.727 m)     Head Circumference --      Peak Flow --      Pain Score 10/15/20 0811 8     Pain Loc --      Pain Edu? --      Excl. in GC? --    Constitutional: Alert and oriented. Well appearing and in no distress. Eyes: Normal exam ENT      Head: Normocephalic and atraumatic.      Mouth/Throat: Mucous membranes are moist. Cardiovascular: Normal rate, regular rhythm.  Respiratory: Normal respiratory effort without tachypnea nor retractions. Breath sounds are clear  Gastrointestinal: Soft and nontender. No distention.  Musculoskeletal: Nontender with normal range of motion in all extremities.  Moderate chest wall tenderness to palpation. Neurologic:  Normal speech and  language. No gross focal neurologic deficits  Skin:  Skin is warm, dry and intact.  Psychiatric: Mood and affect are normal.   ____________________________________________    EKG  EKG viewed and interpreted by myself shows a normal sinus rhythm at 90 bpm with a narrow QRS, normal axis, normal intervals, no concerning ST changes.  ____________________________________________    RADIOLOGY  Chest x-ray is clear CT of the chest is negative for acute abnormality.  ____________________________________________   INITIAL IMPRESSION / ASSESSMENT AND PLAN / ED COURSE  Pertinent labs & imaging results  that were available during my care of the patient were reviewed by me and considered in my medical decision making (see chart for details).   Patient presents emergency department for 3 days of chest pain, mostly central chest pain worse with movement or deep inspiration.  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  Patient does have moderate chest wall tenderness to palpation.  Lab work is reassuring including a negative troponin.  Chest x-ray is clear.  EKG is reassuring.  Patient does states significant pain with inhalation/inspiration.  Given this finding we will obtain CT imaging of the chest as a precaution to rule out pulmonary embolism or intrathoracic abnormality.  Patient agreeable plan of care.  If CTA is negative I believe the patient could be safely discharged home and treated as musculoskeletal pain.  CTA of the chest is negative for acute abnormality.  Given the patient's reassuring work-up we will discharge home with Flexeril, and treat as chest wall discomfort.  Patient agreeable to plan of care.  Provided by normal chest pain return precautions.  Chad Wilcox was evaluated in Emergency Department on 10/15/2020 for the symptoms described in the history of present illness. He was evaluated in the context of the global COVID-19 pandemic, which necessitated consideration that the patient might be at risk for infection with the SARS-CoV-2 virus that causes COVID-19. Institutional protocols and algorithms that pertain to the evaluation of patients at risk for COVID-19 are in a state of rapid change based on information released by regulatory bodies including the CDC and federal and state organizations. These policies and algorithms were followed during the patient's care in the ED.  ____________________________________________   FINAL CLINICAL IMPRESSION(S) / ED DIAGNOSES  Chest pain   Minna Antis, MD 10/15/20  1218
